# Patient Record
Sex: Male | Born: 1985 | Race: Black or African American | Hispanic: No | Marital: Single | Smoking: Never smoker
Health system: Southern US, Community
[De-identification: ages and names within clinical notes are randomized; demographics above are authoritative.]

## PROBLEM LIST (undated history)

## (undated) DIAGNOSIS — K5289 Other specified noninfective gastroenteritis and colitis: Secondary | ICD-10-CM

## (undated) DIAGNOSIS — N200 Calculus of kidney: Secondary | ICD-10-CM

## (undated) DIAGNOSIS — I10 Essential (primary) hypertension: Secondary | ICD-10-CM

## (undated) HISTORY — DX: Calculus of kidney: N20.0

## (undated) HISTORY — DX: Other specified noninfective gastroenteritis and colitis: K52.89

---
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
## 2004-05-03 ENCOUNTER — Encounter: Admission: RE | Admit: 2004-05-03 | Discharge: 2004-06-17 | Payer: Self-pay | Admitting: Specialist

## 2004-09-20 ENCOUNTER — Encounter: Admission: RE | Admit: 2004-09-20 | Discharge: 2004-12-19 | Payer: Self-pay | Admitting: *Deleted

## 2004-10-14 ENCOUNTER — Emergency Department (HOSPITAL_COMMUNITY): Admission: EM | Admit: 2004-10-14 | Discharge: 2004-10-14 | Payer: Self-pay | Admitting: Emergency Medicine

## 2005-03-04 ENCOUNTER — Emergency Department (HOSPITAL_COMMUNITY): Admission: EM | Admit: 2005-03-04 | Discharge: 2005-03-04 | Payer: Self-pay | Admitting: Emergency Medicine

## 2019-03-28 ENCOUNTER — Ambulatory Visit (INDEPENDENT_AMBULATORY_CARE_PROVIDER_SITE_OTHER): Payer: No Typology Code available for payment source | Admitting: Cardiology

## 2019-03-28 ENCOUNTER — Other Ambulatory Visit: Payer: Self-pay

## 2019-03-28 ENCOUNTER — Encounter: Payer: Self-pay | Admitting: Cardiology

## 2019-03-28 VITALS — BP 146/100 | HR 109 | Ht 75.0 in | Wt >= 6400 oz

## 2019-03-28 DIAGNOSIS — I10 Essential (primary) hypertension: Secondary | ICD-10-CM

## 2019-03-28 DIAGNOSIS — K5289 Other specified noninfective gastroenteritis and colitis: Secondary | ICD-10-CM | POA: Diagnosis not present

## 2019-03-28 DIAGNOSIS — R0609 Other forms of dyspnea: Secondary | ICD-10-CM | POA: Diagnosis not present

## 2019-03-28 HISTORY — DX: Essential (primary) hypertension: I10

## 2019-03-28 MED ORDER — AMLODIPINE BESYLATE 5 MG PO TABS: 5.0000 mg | ORAL_TABLET | Freq: Every day | ORAL | 3 refills | Status: DC

## 2019-03-28 NOTE — Progress Notes (Signed)
Patient referred by [name], MND for hypertension, shortness of breath  Subjective:   Henry Thomas, male    DOB: [birth year], 33 y.o.   MRN: [id number]   Chief Complaint  Patient presents with  . Hypertension  . Shortness of Breath    HPI  33 y.o. African American male with hypertension, morbid obesity, h/o mastocytic enterocolitis, strong family history of CAD and hypertension, now referred for management of hypertension and hypoxia/shortness of breath.   Patient is a massage therapist, unfortunately on unemployment currently, due to the pandemic. He is originally from [place]. He had been living in [place] for a few years, and moved back to [place] in 2018, but has not established care with a primary care physician yet. He is going to soon establish care with Dr. Willey Blade.   He has been seeing [name], NMD at [facility], who recently had a telephone visit with him, and were concerned about his hypertension and "low oxygen" noted on pulse oximeter-around 92%. They thus referred the patient to [place], at his request.   Patient has had progressively worsening shortness of breath on exertion with minimal activity. While he has always been obese, his shortness of breath only started about a year or two ago. He lives in a third floor apartment and has to stop several times while climbing upstairs, especially with grocery in his hands. He gets short of breath while bending down to tie his shoes. He also has chest pain with radiation to left arm, although this is not as reproducible with exertion as is the shortness of breath. His blood pressure is elevated. He is currently not on any antihypertensive medications.   Patient was diagnosed with mastocytic enterocolitis many years ago. He has not seen a GI physician in a long time, and was followed by [facility] for management of diarrhea, which has improved over the years.   Past  Medical History:  Diagnosis Date  . HTN (hypertension) 03/28/2019  . Kidney stones   . Mastocytic enterocolitis      History reviewed. No pertinent surgical history.   Social History   Socioeconomic History  . Marital status: Single    Spouse name: Not on file  . Number of children: Not on file  . Years of education: Not on file  . Highest education level: Not on file  Occupational History  . Not on file  Social Needs  . Financial resource strain: Not on file  . Food insecurity    Worry: Not on file    Inability: Not on file  . Transportation needs    Medical: Not on file    Non-medical: Not on file  Tobacco Use  . Smoking status: Not on file  Substance and Sexual Activity  . Alcohol use: Not on file  . Drug use: Not on file  . Sexual activity: Not on file  Lifestyle  . Physical activity    Days per week: Not on file    Minutes per session: Not on file  . Stress: Not on file  Relationships  . Social Herbalist on phone: Not on file    Gets together: Not on file    Attends religious service: Not on file    Active member of club or organization: Not on file    Attends meetings of clubs or organizations: Not on file    Relationship status: Not on file  . Intimate partner  violence    Fear of current or ex partner: Not on file    Emotionally abused: Not on file    Physically abused: Not on file    Forced sexual activity: Not on file  Other Topics Concern  . Not on file  Social History Narrative  . Not on file     Family History  Problem Relation Age of Onset  . Hypertension Mother   . Hypertension Father   . Hypertension Sister   . Heart attack Maternal Uncle        MI and CABG in 11s  . Heart attack Maternal Grandfather   . Stroke Maternal Grandfather     Current Outpatient Medications on File Prior to Visit  Medication Sig Dispense Refill  . Multiple Vitamin (MULTIVITAMIN) capsule Take 1 capsule by mouth daily.    . Probiotic Product  (PROBIOTIC DAILY PO) Take by mouth daily.    . TURMERIC PO Take by mouth daily.     No current facility-administered medications on file prior to visit.     Cardiovascular studies:  EKG 03/28/2019: Sinus rhythm 94 bpm. 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  Poor R-wave progression -nondiagnostic for this age.    Recent labs: 02/06/2019: Glucose 100. BUN/Cr 9/1.21. eGFR 78/90. Na/K 142/4.7. HbA1C 5.8%.  H/H 17/52. MCV 81. Platelets 278.  Review of Systems  Constitution: Positive for malaise/fatigue. Negative for decreased appetite, weight gain and weight loss.       Snoring +  HENT: Negative for congestion.   Eyes: Negative for visual disturbance.  Cardiovascular: Positive for chest pain and dyspnea on exertion. Negative for leg swelling, palpitations and syncope.  Respiratory: Positive for shortness of breath. Negative for cough.   Endocrine: Negative for cold intolerance.  Hematologic/Lymphatic: Does not bruise/bleed easily.  Skin: Negative for itching and rash.  Musculoskeletal: Negative for myalgias.  Gastrointestinal: Negative for abdominal pain, nausea and vomiting.  Genitourinary: Negative for dysuria.  Neurological: Negative for dizziness and weakness.  Psychiatric/Behavioral: The patient is not nervous/anxious.   All other systems reviewed and are negative.        Vitals:   03/28/19 1437  BP: (!) 146/100  Pulse: (!) 109  SpO2: 95%     Body mass index is 51.87 kg/m. Filed Weights   03/28/19 1437  Weight: (!) 415 lb (188.2 kg)     Objective:   Physical Exam  Constitutional: He is oriented to person, place, and time. He appears well-developed and well-nourished. No distress.  Morbidly obese  HENT:  Head: Normocephalic and atraumatic.  Eyes: Pupils are equal, round, and reactive to light. Conjunctivae are normal.  Neck: No JVD present.  Cardiovascular: Normal rate, regular rhythm and intact distal pulses.  Pulmonary/Chest: Effort normal  and breath sounds normal. He has no wheezes. He has no rales.  Abdominal: Soft. Bowel sounds are normal. There is no rebound.  Musculoskeletal:        General: No edema (Trace b/l).  Lymphadenopathy:    He has no cervical adenopathy.  Neurological: He is alert and oriented to person, place, and time. No cranial nerve deficit.  Skin: Skin is warm and dry.  Trophic skin changes on neck   Psychiatric: He has a normal mood and affect.  Nursing note and vitals reviewed.         Assessment & Recommendations:   33 y.o. African American male with hypertension, morbid obesity, h/o mastocytic enterocolitis, strong family history of CAD and hypertension, now referred for management  of hypertension and hypoxia/shortness of breath.   Exertional dyspnea: Likely multifactorial, including hypertension, morbid obesity. I will obtain echocardiogram to evaluate for cardiomyopathy. After improving blood pressure and stabilizing his dyspnea, I will also perform a stress test-although this is likely to be limited due to his body habitus. I am also concerned that he likely has OSA/OHS. If cardiac workup is unremarkable, and dyspnea persists in spite of controlled blood pressure, he will likely need referral to pulmonology.   Hypertension: Start amlodipine 5 mg daily. Due to his history of kidney stones, I will try to avoid HCTZ/chlorthalidone.   In addition, I have encouraged him to establish care with PCP.  Thank you for referring the patient to [place]. Please feel free to contact with any questions.  Nigel Mormon, MD [facility] Pager: [phone] Office: [phone] If no answer Cell [phone]

## 2019-03-29 ENCOUNTER — Encounter: Payer: Self-pay | Admitting: Cardiology

## 2019-03-29 DIAGNOSIS — R0609 Other forms of dyspnea: Secondary | ICD-10-CM | POA: Insufficient documentation

## 2019-03-29 DIAGNOSIS — K5289 Other specified noninfective gastroenteritis and colitis: Secondary | ICD-10-CM | POA: Insufficient documentation

## 2019-04-01 ENCOUNTER — Telehealth: Payer: Self-pay

## 2019-04-01 NOTE — Telephone Encounter (Signed)
Telephone encounter:  Reason for call: Pt states the amlodipine that you put him on has not lowered his BP and he is concerned   Usual provider: MP  Last office visit: 03/28/2019  Next office visit: 04/19/2019   Last hospitalization:  Current Outpatient Medications on File Prior to Visit  Medication Sig Dispense Refill  . amLODipine (NORVASC) 5 MG tablet Take 1 tablet (5 mg total) by mouth daily. 30 tablet 3  . Multiple Vitamin (MULTIVITAMIN) capsule Take 1 capsule by mouth daily.    . Probiotic Product (PROBIOTIC DAILY PO) Take by mouth daily.    . TURMERIC PO Take by mouth daily.     No current facility-administered medications on file prior to visit.

## 2019-04-01 NOTE — Telephone Encounter (Signed)
Increase to 10 mg daily. Please keep [place] posted.  Thanks MJP

## 2019-04-03 NOTE — Telephone Encounter (Signed)
Left detailed vm °

## 2019-04-19 ENCOUNTER — Ambulatory Visit (INDEPENDENT_AMBULATORY_CARE_PROVIDER_SITE_OTHER): Payer: No Typology Code available for payment source

## 2019-04-19 ENCOUNTER — Encounter: Payer: Self-pay | Admitting: Cardiology

## 2019-04-19 ENCOUNTER — Other Ambulatory Visit: Payer: Self-pay

## 2019-04-19 ENCOUNTER — Ambulatory Visit (INDEPENDENT_AMBULATORY_CARE_PROVIDER_SITE_OTHER): Payer: No Typology Code available for payment source | Admitting: Cardiology

## 2019-04-19 DIAGNOSIS — I10 Essential (primary) hypertension: Secondary | ICD-10-CM

## 2019-04-19 DIAGNOSIS — R0609 Other forms of dyspnea: Secondary | ICD-10-CM

## 2019-04-19 MED ORDER — AMLODIPINE BESYLATE 5 MG PO TABS: 10.0000 mg | ORAL_TABLET | Freq: Every day | ORAL | 3 refills | Status: DC

## 2019-04-19 MED ORDER — HYDROCHLOROTHIAZIDE 25 MG PO TABS: 25.0000 mg | ORAL_TABLET | Freq: Every day | ORAL | 3 refills | Status: DC

## 2019-04-19 NOTE — Progress Notes (Signed)
Patient referred by [name], MND for hypertension, shortness of breath  Subjective:   Henry Thomas, male    DOB: [birth year], 33 y.o.   MRN: [id number]  Chief Complaint  Patient presents with  . Shortness of Breath  . Follow-up    HPI  33 y.o. African American male with hypertension, morbid obesity, h/o mastocytic enterocolitis, strong family history of CAD and hypertension, with exertional dyspnea  Echocardiogram was unremarkable, other than mo LVH and grade 1 DD. Blood pressure remains elevated.   Past Medical History:  Diagnosis Date  . HTN (hypertension) 03/28/2019  . Kidney stones   . Mastocytic enterocolitis      No past surgical history on file.   Social History   Socioeconomic History  . Marital status: Single    Spouse name: Not on file  . Number of children: 0  . Years of education: Not on file  . Highest education level: Not on file  Occupational History  . Not on file  Social Needs  . Financial resource strain: Not on file  . Food insecurity    Worry: Not on file    Inability: Not on file  . Transportation needs    Medical: Not on file    Non-medical: Not on file  Tobacco Use  . Smoking status: Never Smoker  . Smokeless tobacco: Never Used  Substance and Sexual Activity  . Alcohol use: Not Currently  . Drug use: Not on file  . Sexual activity: Not on file  Lifestyle  . Physical activity    Days per week: Not on file    Minutes per session: Not on file  . Stress: Not on file  Relationships  . Social Herbalist on phone: Not on file    Gets together: Not on file    Attends religious service: Not on file    Active member of club or organization: Not on file    Attends meetings of clubs or organizations: Not on file    Relationship status: Not on file  . Intimate partner violence    Fear of current or ex partner: Not on file    Emotionally abused: Not on file    Physically abused: Not on file    Forced sexual activity:  Not on file  Other Topics Concern  . Not on file  Social History Narrative  . Not on file     Family History  Problem Relation Age of Onset  . Hypertension Mother   . Hypertension Father   . Hypertension Sister   . Heart attack Maternal Uncle        MI and CABG in 16s  . Heart attack Maternal Grandfather   . Stroke Maternal Grandfather     Current Outpatient Medications on File Prior to Visit  Medication Sig Dispense Refill  . amLODipine (NORVASC) 5 MG tablet Take 1 tablet (5 mg total) by mouth daily. 30 tablet 3  . Multiple Vitamin (MULTIVITAMIN) capsule Take 1 capsule by mouth daily.    . Probiotic Product (PROBIOTIC DAILY PO) Take by mouth daily.    . TURMERIC PO Take by mouth daily.     No current facility-administered medications on file prior to visit.     Cardiovascular studies:  Echocardiogram 04/19/2019: Normal LV systolic function with EF 55%. Left ventricle cavity is normal in size. Moderate concentric hypertrophy of the left ventricle. Normal global wall motion. Doppler evidence of grade I (impaired) diastolic dysfunction, normal  LAP. Calculated EF 55%. Left atrial cavity is mildly dilated. Inadequate TR jet to estimate pulmonary artery systolic pressure. Estimated RA pressure 8 mmHg.  EKG 03/28/2019: Sinus rhythm 94 bpm. 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  Poor R-wave progression -nondiagnostic for this age.    Recent labs: 02/06/2019: Glucose 100. BUN/Cr 9/1.21. eGFR 78/90. Na/K 142/4.7. HbA1C 5.8%.  H/H 17/52. MCV 81. Platelets 278.  Review of Systems  Constitution: Positive for malaise/fatigue. Negative for decreased appetite, weight gain and weight loss.       Snoring +  HENT: Negative for congestion.   Eyes: Negative for visual disturbance.  Cardiovascular: Positive for chest pain and dyspnea on exertion. Negative for leg swelling, palpitations and syncope.  Respiratory: Positive for shortness of breath. Negative for cough.    Endocrine: Negative for cold intolerance.  Hematologic/Lymphatic: Does not bruise/bleed easily.  Skin: Negative for itching and rash.  Musculoskeletal: Negative for myalgias.  Gastrointestinal: Negative for abdominal pain, nausea and vomiting.  Genitourinary: Negative for dysuria.  Neurological: Negative for dizziness and weakness.  Psychiatric/Behavioral: The patient is not nervous/anxious.   All other systems reviewed and are negative.        Vitals:   04/19/19 1344  BP: (!) 153/83  Pulse: 98  Temp: 97.8 F (36.6 C)  SpO2: 97%     Body mass index is 53 kg/m. Filed Weights   04/19/19 1344  Weight: (!) 424 lb (192.3 kg)     Objective:   Physical Exam  Constitutional: He is oriented to person, place, and time. He appears well-developed and well-nourished. No distress.  Morbidly obese  HENT:  Head: Normocephalic and atraumatic.  Eyes: Pupils are equal, round, and reactive to light. Conjunctivae are normal.  Neck: No JVD present.  Cardiovascular: Normal rate, regular rhythm and intact distal pulses.  Pulmonary/Chest: Effort normal and breath sounds normal. He has no wheezes. He has no rales.  Abdominal: Soft. Bowel sounds are normal. There is no rebound.  Musculoskeletal:        General: No edema (Trace b/l).  Lymphadenopathy:    He has no cervical adenopathy.  Neurological: He is alert and oriented to person, place, and time. No cranial nerve deficit.  Skin: Skin is warm and dry.  Trophic skin changes on neck   Psychiatric: He has a normal mood and affect.  Nursing note and vitals reviewed.         Assessment & Recommendations:   33 y.o. African American male with hypertension, morbid obesity, h/o mastocytic enterocolitis, strong family history of CAD and hypertension, with exertional dyspnea  Exertional dyspnea: Likely multifactorial, including hypertension, morbid obesity. Echocardiogram shows normal systolic function, grade 1 DD. Blood pressure  remains elevated. I added HCTZ 25 mg.. Refilled amlodipine 10 mg. I have also referred him to pulmonology given concern for OSA/OHA.  Hypertension: As above.  F/u in 3 months.  Nigel Mormon, MD [facility] Pager: [phone] Office: [phone] If no answer Cell [phone]

## 2019-04-20 ENCOUNTER — Emergency Department (HOSPITAL_COMMUNITY): Payer: PRIVATE HEALTH INSURANCE

## 2019-04-20 ENCOUNTER — Emergency Department (HOSPITAL_COMMUNITY)
Admission: EM | Admit: 2019-04-20 | Discharge: 2019-04-20 | Disposition: A | Discharge status: Home / Self Care | Payer: PRIVATE HEALTH INSURANCE | Attending: Emergency Medicine | Admitting: Emergency Medicine

## 2019-04-20 ENCOUNTER — Other Ambulatory Visit: Payer: Self-pay

## 2019-04-20 ENCOUNTER — Encounter (HOSPITAL_COMMUNITY): Payer: Self-pay | Admitting: *Deleted

## 2019-04-20 DIAGNOSIS — Z79899 Other long term (current) drug therapy: Secondary | ICD-10-CM | POA: Insufficient documentation

## 2019-04-20 DIAGNOSIS — T7840XA Allergy, unspecified, initial encounter: Secondary | ICD-10-CM | POA: Diagnosis not present

## 2019-04-20 DIAGNOSIS — R0989 Other specified symptoms and signs involving the circulatory and respiratory systems: Secondary | ICD-10-CM | POA: Diagnosis present

## 2019-04-20 DIAGNOSIS — I10 Essential (primary) hypertension: Secondary | ICD-10-CM | POA: Insufficient documentation

## 2019-04-20 LAB — BASIC METABOLIC PANEL
Anion gap: 10 (ref 5–15)
BUN: 9 mg/dL (ref 6–20)
CO2: 20 mmol/L — ABNORMAL LOW (ref 22–32)
Calcium: 8.8 mg/dL — ABNORMAL LOW (ref 8.9–10.3)
Chloride: 108 mmol/L (ref 98–111)
Creatinine, Ser: 1.09 mg/dL (ref 0.61–1.24)
GFR calc Af Amer: >60 mL/min (ref >60)
GFR calc non Af Amer: >60 mL/min (ref >60)
Glucose, Bld: 116 mg/dL — ABNORMAL HIGH (ref 70–99)
Potassium: 3.9 mmol/L (ref 3.5–5.1)
Sodium: 138 mmol/L (ref 135–145)

## 2019-04-20 LAB — CBC
HCT: 51.7 % (ref 39.0–52.0)
Hemoglobin: 16.1 g/dL (ref 13.0–17.0)
MCH: 26.3 pg (ref 26.0–34.0)
MCHC: 31.1 g/dL (ref 30.0–36.0)
MCV: 84.5 fL (ref 80.0–100.0)
Platelets: 279 10*3/uL (ref 150–400)
RBC: 6.12 MIL/uL — ABNORMAL HIGH (ref 4.22–5.81)
RDW: 14.8 % (ref 11.5–15.5)
WBC: 8.1 10*3/uL (ref 4.0–10.5)
nRBC: 0 % (ref 0.0–0.2)

## 2019-04-20 MED ORDER — DIPHENHYDRAMINE HCL 25 MG PO TABS: 25.0000 mg | ORAL_TABLET | Freq: Four times a day (QID) | ORAL | 0 refills | Status: AC | PRN

## 2019-04-20 MED ORDER — DEXAMETHASONE SODIUM PHOSPHATE 10 MG/ML IJ SOLN
10.0000 mg | Freq: Once | INTRAMUSCULAR | Status: AC
  Administered 2019-04-20: 05:00:00 10 mg via INTRAVENOUS
  Filled 2019-04-20: qty 1

## 2019-04-20 MED ORDER — DIPHENHYDRAMINE HCL 50 MG/ML IJ SOLN
25.0000 mg | Freq: Once | INTRAMUSCULAR | Status: AC
Start: 2019-04-20 — End: 2019-04-20
  Administered 2019-04-20: 05:00:00 25 mg via INTRAVENOUS
  Filled 2019-04-20: qty 1

## 2019-04-20 MED ORDER — PREDNISONE 10 MG PO TABS: 20.0000 mg | ORAL_TABLET | Freq: Two times a day (BID) | ORAL | 0 refills | Status: AC

## 2019-04-20 MED ORDER — SODIUM CHLORIDE 0.9% FLUSH
3.0000 mL | Freq: Once | INTRAVENOUS | Status: AC
  Administered 2019-04-20: 04:00:00 3 mL via INTRAVENOUS

## 2019-04-20 MED ORDER — FAMOTIDINE IN NACL 20-0.9 MG/50ML-% IV SOLN
20.0000 mg | Freq: Once | INTRAVENOUS | Status: AC
  Administered 2019-04-20: 20 mg via INTRAVENOUS
  Filled 2019-04-20: qty 50

## 2019-04-20 NOTE — ED Triage Notes (Signed)
The pt woke up approx 0230 from sleep feeling like he could not breathe  He feels like something is blocking his airway.  Both his hands are sl swelling and redness   He has not taken anything

## 2019-04-20 NOTE — Discharge Instructions (Addendum)
You were seen in the emergency department today for throat swelling.  This may have been related to an allergic reaction.  You were given steroids, Benadryl, and Pepcid in the emergency department with some improvement.  We are sending you home with a short course of prednisone as well as as needed Benadryl, please take these as prescribed.  We have prescribed you new medication(s) today. Discuss the medications prescribed today with your pharmacist as they can have adverse effects and interactions with your other medicines including over the counter and prescribed medications. Seek medical evaluation if you start to experience new or abnormal symptoms after taking one of these medicines, seek care immediately if you start to experience difficulty breathing, feeling of your throat closing, facial swelling, or rash as these could be indications of a more serious allergic reaction  Please follow-up with your primary care provider within 3 days.  Return to the emergency department immediately for throat closing sensation again, trouble breathing, vomiting, passing out, noisy breathing, or any other concerns.

## 2019-04-20 NOTE — ED Provider Notes (Addendum)
[facility] EMERGENCY DEPARTMENT Provider Note   CSN: [id number] Arrival date & time: 04/20/19  2423     History   Chief Complaint Chief Complaint  Patient presents with  . Allergic Reaction    HPI Henry Thomas is a 33 y.o. male with a history of hypertension and obesity who presents to the emergency department with complaints of abnormal sensation to the throat that began at 0230 this a.m. when he awoke from sleep.  Patient states that he ate a fairly typical dinner for him last night prior to going to bed, did not have any difficulty swallowing any part of his meal, went to sleep feeling at baseline.  States that he woke from sleep at 230 feeling like something was stuck in his throat, he feels that his throat is swollen/closing somewhat and did feel short of breath at onset, however this seems to be somewhat improved upon arrival to the ER.  No alleviating or aggravating factors.  He states that he denies any associated pain with this.    He denies facial swelling, chest pain, dyspnea, nausea, vomiting, or rash.  He states he tried drinking fluids prior to arrival which he was able to keep down but it did not seem to change his symptoms much.  He states he has a history of somewhat similar a few years ago, but at that time had a fever and had an infection, has not had fever or infectious symptoms recently.  He has not had any new exposures, medicines, or foods recently.  He was recently given prescriptions for hydrochlorothiazide and amlodipine for blood pressure management but has not started taking these yet.     HPI  Past Medical History:  Diagnosis Date  . HTN (hypertension) 03/28/2019  . Kidney stones   . Mastocytic enterocolitis     Patient Active Problem List   Diagnosis Date Noted  . Exertional dyspnea 03/29/2019  . Morbid obesity ([place]) 03/29/2019  . 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 HTN (hypertension) 03/28/2019    History reviewed. No  pertinent surgical history.      Home Medications    Prior to Admission medications   Medication Sig Start Date End Date Taking? Authorizing Provider  amLODipine (NORVASC) 5 MG tablet Take 2 tablets (10 mg total) by mouth daily. 04/19/19 07/18/19  Patwardhan, Reynold Bowen, MD  hydrochlorothiazide (HYDRODIURIL) 25 MG tablet Take 1 tablet (25 mg total) by mouth daily. 04/19/19 07/18/19  Patwardhan, Reynold Bowen, MD  Multiple Vitamin (MULTIVITAMIN) capsule Take 1 capsule by mouth daily.    [provider]  Probiotic Product (PROBIOTIC DAILY PO) Take by mouth daily.    [provider]  TURMERIC PO Take by mouth daily.    [provider]    Family History Family History  Problem Relation Age of Onset  . Hypertension Mother   . Hypertension Father   . Hypertension Sister   . Heart attack Maternal Uncle        MI and CABG in 9s  . Heart attack Maternal Grandfather   . Stroke Maternal Grandfather     Social History Social History   Tobacco Use  . Smoking status: Never Smoker  . Smokeless tobacco: Never Used  Substance Use Topics  . Alcohol use: Not Currently  . Drug use: Not on file     Allergies   Patient has no known allergies.   Review of Systems Review of Systems  Constitutional: Negative for chills and fever.  HENT:  Positive for trouble swallowing. Negative for congestion, ear pain and sore throat.   Respiratory: Negative for shortness of breath and wheezing.   Cardiovascular: Negative for chest pain.  Gastrointestinal: Negative for nausea and vomiting.  Skin: Negative for rash.  Neurological: Negative for syncope, weakness and numbness.  All other systems reviewed and are negative.    Physical Exam Updated Vital Signs BP (!) 160/90   Pulse 94   Temp 98.6 F (37 C) (Oral)   Resp 20   Ht 6\' 3"  (1.905 m)   Wt (!) 192.3 kg   SpO2 95%   BMI 52.99 kg/m   Physical Exam Vitals signs and nursing note reviewed.  Constitutional:       General: He is not in acute distress.    Appearance: He is well-developed. He is not toxic-appearing.  HENT:     Head: Normocephalic and atraumatic.     Right Ear: Tympanic membrane, ear canal and external ear normal.     Left Ear: Tympanic membrane, ear canal and external ear normal.     Nose: Nose normal.     Mouth/Throat:     Mouth: Mucous membranes are moist.     Comments: No facial/lip swelling. Uvula is midline and appears slightly swollen, no other swelling noted in the oral/posterior oropharynx..  No tonsillar edema/exudate/erythema. Posterior oropharynx is symmetric appearing. Patient tolerating own secretions without difficulty. No trismus. No drooling. No hot potato voice. No swelling beneath the tongue, submandibular compartment is soft.  Eyes:     General:        Right eye: No discharge.        Left eye: No discharge.     Conjunctiva/sclera: Conjunctivae normal.  Neck:     Musculoskeletal: Neck supple. No edema, neck rigidity, crepitus or pain with movement.     Thyroid: No thyroid mass.  Cardiovascular:     Rate and Rhythm: Normal rate and regular rhythm.  Pulmonary:     Effort: Pulmonary effort is normal. No respiratory distress.     Breath sounds: Normal breath sounds. No stridor. No wheezing, rhonchi or rales.  Abdominal:     General: There is no distension.     Palpations: Abdomen is soft.     Tenderness: There is no abdominal tenderness.  Skin:    General: Skin is warm and dry.     Findings: Rash is not urticarial.     Comments: Mild erythema noted to the hands, not urticarial in nature.  Neurological:     Mental Status: He is alert.     Comments: Clear speech.   Psychiatric:        Behavior: Behavior normal.     ED Treatments / Results  Labs (all labs ordered are listed, but only abnormal results are displayed) Labs Reviewed  BASIC METABOLIC PANEL - Abnormal; Notable for the following components:      Result Value   CO2 20 (*)    Glucose, Bld 116  (*)    Calcium 8.8 (*)    All other components within normal limits  CBC - Abnormal; Notable for the following components:   RBC 6.12 (*)    All other components within normal limits    EKG None  Radiology Dg Chest 2 View  Result Date: 04/20/2019 CLINICAL DATA:  Shortness of breath EXAM: CHEST - 2 VIEW COMPARISON:  None. FINDINGS: The heart size and mediastinal contours are within normal limits. Both lungs are clear. The visualized skeletal structures are unremarkable. IMPRESSION:  No active cardiopulmonary disease. Electronically Signed   By: Deatra Robinson M.D.   On: 04/20/2019 05:17    Procedures Procedures (including critical care time)  Medications Ordered in ED Medications  sodium chloride flush (NS) 0.9 % injection 3 mL (3 mLs Intravenous Given 04/20/19 0418)  diphenhydrAMINE (BENADRYL) injection 25 mg (25 mg Intravenous Given 04/20/19 0452)  famotidine (PEPCID) IVPB 20 mg premix (0 mg Intravenous Stopped 04/20/19 0609)  dexamethasone (DECADRON) injection 10 mg (10 mg Intravenous Given 04/20/19 0453)     Initial Impression / Assessment and Plan / ED Course  I have reviewed the triage vital signs and the nursing notes.  Pertinent labs & imaging results that were available during my care of the patient were reviewed by me and considered in my medical decision making (see chart for details).   Patient presents to the emergency department with abnormal sensation to his throat.  Nontoxic-appearing, no apparent distress, vitals with elevated BP, otherwise within normal limits.  Doubt HTN emergency.  On exam he has minimal swelling noted to the uvula, no other swelling to the face/oropharynx.  He is tolerating his own secretions without difficulty, no trismus, no drooling, no wheezing, no stridor. Possible degree of allergic reaction of unclear source, does not appear to be in anaphylactic shock, discussed w/ Dr. Judd Lien who has evaluated patient @ bedside, recommends  decadron/benadryl/pepcid & monitoring.   Work-up per triage: CBC: No anemia or leukocytosis.  BMP: No significant electrolyte derangement CXR: No acute abnormalities.  07:10: RE-EVAL: Patient feeling somewhat improved.  Tolerating own secretions w/o difficulty. No signs of respiratory distress. Has been ambulatory throughout the ED. Re-discussed w/ Dr. Judd Lien- recommends discharge home with 20 mg of prednisone BID for a few days and benadryl PRN which I am in agreement with.   I discussed results, treatment plan, need for follow-up, and return precautions with the patient. Provided opportunity for questions, patient confirmed understanding and is in agreement with plan.      This is a shared visit with supervising physician Dr. Judd Lien who has independently evaluated patient & provided guidance in evaluation/management/disposition, in agreement with care    Final Clinical Impressions(s) / ED Diagnoses   Final diagnoses:  Allergic reaction, initial encounter    ED Discharge Orders         Ordered    predniSONE (DELTASONE) 10 MG tablet  2 times daily     04/20/19 0713    diphenhydrAMINE (BENADRYL) 25 MG tablet  Every 6 hours PRN     04/20/19 0713           Cherly Anderson, PA-C 04/20/19 0716    Jaevion Goto, Pleas Koch, PA-C 04/20/19 4076    Geoffery Lyons, MD 04/20/19 2312

## 2019-04-20 NOTE — ED Notes (Signed)
Pt states swelling and redness to bilateral hands is normal. Also states that "bumps" on his head are normal.

## 2019-04-20 NOTE — ED Notes (Signed)
Patient verbalizes understanding of discharge instructions. Opportunity for questioning and answers were provided. Armband removed by staff, pt discharged from ED.  

## 2019-05-14 ENCOUNTER — Other Ambulatory Visit: Payer: Self-pay

## 2019-05-14 ENCOUNTER — Encounter: Payer: Self-pay | Admitting: Pulmonary Disease

## 2019-05-14 ENCOUNTER — Ambulatory Visit (INDEPENDENT_AMBULATORY_CARE_PROVIDER_SITE_OTHER): Payer: No Typology Code available for payment source | Admitting: Pulmonary Disease

## 2019-05-14 DIAGNOSIS — R0600 Dyspnea, unspecified: Secondary | ICD-10-CM | POA: Diagnosis not present

## 2019-05-14 DIAGNOSIS — R0602 Shortness of breath: Secondary | ICD-10-CM | POA: Diagnosis not present

## 2019-05-14 DIAGNOSIS — G4733 Obstructive sleep apnea (adult) (pediatric): Secondary | ICD-10-CM | POA: Diagnosis not present

## 2019-05-14 DIAGNOSIS — R0609 Other forms of dyspnea: Secondary | ICD-10-CM

## 2019-05-14 NOTE — Patient Instructions (Signed)
Proceed with CT angiogram chest to rule out blood clots  Schedule home sleep study. Based on this we will set you up with a CPAP machine

## 2019-05-14 NOTE — Progress Notes (Signed)
Subjective:     Patient ID: Henry Thomas, male   DOB: August 05, 1986, 33 y.o.   MRN: [id number]  HPI   Chief Complaint  Patient presents with  . Consult    Shortness of breath when he exerts himself. Stated there are times he feels ok and other times he feels like he hsa been in a marathon.    33 year old morbidly obese man presents for evaluation of dyspnea on exertion.  He has been at his current weight for a few years and in fact states that he has been more active during the pandemic. Dyspnea on exertion has been present for at least 9 months and has progressed to class III-IV.  He feels very dyspneic on climbing up to his third floor apartment.  At times even simple tasks such as doing his dishes causes him to be dyspneic.  He does have an oximeter and occasionally has found that when he lies down saturations fall to 88% but this has not occurred in the past few months.  Sitting up and resting makes his dyspnea , occasionally he will turn the fan on to get more air.  His PCP gave him Symbicort and this helps sometimes.  On 04/20/2019 had an ED visit for throat swelling and he woke up and felt like his uvula was stuck to his tongue, he was given prednisone for a few days with improvement.  Chest x-ray which I reviewed does not show any infiltrates or effusions.  Basic labs appear normal except for creatinine of 1.1  He underwent cardiology evaluation including an echo which showed LVH and grade 2 diastolic dysfunction, has been placed on thiazide diuretic.  He denies paroxysmal nocturnal dyspnea, orthopnea or significant pedal edema  He was diagnosed with mastocytic enterocolitis in 2010 when he used to live in [place], moved to [place] 2 years ago.  He has chronic diarrhea and bloating symptoms since then.   Epworth sleepiness score is 14 and he reports excessive daytime fatigue and sleepiness. He is tired all day.  Bedtime is anywhere between 10 and 11 PM but he is not consistent  with this, he wakes up every hour.  He sleeps on his side with one pillow, reports 3-4 episodes of nocturia and is out of bed at 7 AM feeling tired with dryness of his mouth and occasional headaches. There is no history suggestive of cataplexy, sleep paralysis or parasomnias     Past Medical History:  Diagnosis Date  . HTN (hypertension) 03/28/2019  . Kidney stones   . Mastocytic enterocolitis     No past surgical history on file.  No Known Allergies  Social History   Socioeconomic History  . Marital status: Single    Spouse name: Not on file  . Number of children: 0  . Years of education: Not on file  . Highest education level: Not on file  Occupational History  . Not on file  Social Needs  . Financial resource strain: Not on file  . Food insecurity    Worry: Not on file    Inability: Not on file  . Transportation needs    Medical: Not on file    Non-medical: Not on file  Tobacco Use  . Smoking status: Never Smoker  . Smokeless tobacco: Never Used  Substance and Sexual Activity  . Alcohol use: Not Currently  . Drug use: Not on file  . Sexual activity: Not on file  Lifestyle  . Physical activity    Days  per week: Not on file    Minutes per session: Not on file  . Stress: Not on file  Relationships  . Social Herbalist on phone: Not on file    Gets together: Not on file    Attends religious service: Not on file    Active member of club or organization: Not on file    Attends meetings of clubs or organizations: Not on file    Relationship status: Not on file  . Intimate partner violence    Fear of current or ex partner: Not on file    Emotionally abused: Not on file    Physically abused: Not on file    Forced sexual activity: Not on file  Other Topics Concern  . Not on file  Social History Narrative  . Not on file     Family History  Problem Relation Age of Onset  . Hypertension Mother   . Hypertension Father   . Hypertension Sister   .  Heart attack Maternal Uncle        MI and CABG in 69s  . Heart attack Maternal Grandfather   . Stroke Maternal Grandfather      Review of Systems  Reason unable to perform ROS: Tree Pollen.  Constitutional: Positive for activity change and fatigue.  HENT: Negative.   Eyes: Negative.   Respiratory: Positive for apnea, choking, shortness of breath and wheezing.   Cardiovascular: Positive for palpitations.  Gastrointestinal: Positive for abdominal distention, abdominal pain, blood in stool and diarrhea.  Endocrine: Negative.   Genitourinary: Positive for frequency and hematuria.  Musculoskeletal: Positive for back pain and neck pain.  Skin: Positive for rash.       Hives  Allergic/Immunologic: Positive for environmental allergies, food allergies and immunocompromised state.       Corn, Barley  Neurological: Positive for dizziness, speech difficulty, weakness and light-headedness.  Hematological: Negative.   Psychiatric/Behavioral: Positive for decreased concentration. The patient is nervous/anxious.        Objective:   Physical Exam    Gen. Pleasant, obese, in no distress, normal affect ENT - no pallor,icterus, no post nasal drip, class 2-3 airway Neck: No JVD, no thyromegaly, no carotid bruits Lungs: no use of accessory muscles, no dullness to percussion, decreased without rales or rhonchi  Cardiovascular: Rhythm regular, heart sounds  normal, no murmurs or gallops, no peripheral edema Abdomen: soft and non-tender, no hepatosplenomegaly, BS normal. Musculoskeletal: No deformities, no cyanosis or clubbing Neuro:  alert, non focal, no tremors

## 2019-05-14 NOTE — Addendum Note (Signed)
Addended by: Rigoberto Noel on: 05/14/2019 10:51 AM   Modules accepted: Orders

## 2019-05-14 NOTE — Assessment & Plan Note (Signed)
His dyspnea is likely related to morbid obesity but given subacute worsening over the past 6 months we have to investigate for the usual culprits. Can proceed with CT angiogram provided his weight will allow-this will enable [place] to rule out pulmonary emboli as a cause and also look at his parenchyma and rule out other issues such as sarcoidosis.  If his weight does not qualify, then can do VQ scan instead  We will continue on his Symbicort currently and if no other causes found then we can proceed with PFTs

## 2019-05-14 NOTE — Assessment & Plan Note (Signed)
Given excessive daytime somnolence, narrow pharyngeal exam, witnessed apneas & loud snoring, obstructive sleep apnea is very likely & an overnight polysomnogram will be scheduled as a  Home or split study. The pathophysiology of obstructive sleep apnea , it's cardiovascular consequences & modes of treatment including CPAP were discused with the patient in detail & they evidenced understanding.  It is very likely that his sleep issues are related to OSA.  He may not have obesity hypoventilation since his bicarbonate is 20.  We will obtain an ABG in the future if needed

## 2019-05-23 ENCOUNTER — Ambulatory Visit (HOSPITAL_BASED_OUTPATIENT_CLINIC_OR_DEPARTMENT_OTHER)
Admission: RE | Admit: 2019-05-23 | Discharge: 2019-05-23 | Disposition: A | Discharge status: Home / Self Care | Payer: PRIVATE HEALTH INSURANCE | Source: Ambulatory Visit | Attending: Pulmonary Disease | Admitting: Pulmonary Disease

## 2019-05-23 ENCOUNTER — Encounter (HOSPITAL_BASED_OUTPATIENT_CLINIC_OR_DEPARTMENT_OTHER): Payer: Self-pay

## 2019-05-23 ENCOUNTER — Other Ambulatory Visit: Payer: Self-pay

## 2019-05-23 DIAGNOSIS — R0602 Shortness of breath: Secondary | ICD-10-CM | POA: Diagnosis not present

## 2019-05-23 MED ORDER — IOHEXOL 350 MG/ML SOLN
100.0000 mL | Freq: Once | INTRAVENOUS | Status: AC | PRN
  Administered 2019-05-23: 11:00:00 100 mL via INTRAVENOUS

## 2019-05-27 ENCOUNTER — Other Ambulatory Visit: Payer: Self-pay

## 2019-05-27 ENCOUNTER — Ambulatory Visit: Payer: No Typology Code available for payment source

## 2019-05-27 DIAGNOSIS — G4733 Obstructive sleep apnea (adult) (pediatric): Secondary | ICD-10-CM

## 2019-05-28 ENCOUNTER — Other Ambulatory Visit: Payer: Self-pay | Admitting: Cardiology

## 2019-05-28 DIAGNOSIS — R0609 Other forms of dyspnea: Secondary | ICD-10-CM

## 2019-05-28 DIAGNOSIS — G4733 Obstructive sleep apnea (adult) (pediatric): Secondary | ICD-10-CM

## 2019-05-28 DIAGNOSIS — I10 Essential (primary) hypertension: Secondary | ICD-10-CM

## 2019-05-29 ENCOUNTER — Telehealth: Payer: Self-pay | Admitting: Pulmonary Disease

## 2019-05-29 DIAGNOSIS — G4733 Obstructive sleep apnea (adult) (pediatric): Secondary | ICD-10-CM

## 2019-05-29 NOTE — Telephone Encounter (Signed)
HST 05/28/19 showed moderate OSA AHI 19/h Suggest autoCPAP 5-20 cm , FF mask of choice OV in 6 weeks

## 2019-05-31 NOTE — Telephone Encounter (Signed)
Advised pt of results. Pt understood and nothing further is needed.   CPAP ordered.  

## 2019-06-18 ENCOUNTER — Other Ambulatory Visit: Payer: Self-pay | Admitting: Cardiology

## 2019-06-18 DIAGNOSIS — R0609 Other forms of dyspnea: Secondary | ICD-10-CM

## 2019-06-18 DIAGNOSIS — I10 Essential (primary) hypertension: Secondary | ICD-10-CM

## 2019-06-25 ENCOUNTER — Ambulatory Visit: Payer: No Typology Code available for payment source | Admitting: Pulmonary Disease

## 2019-07-17 ENCOUNTER — Ambulatory Visit: Payer: No Typology Code available for payment source | Admitting: Pulmonary Disease

## 2019-07-19 ENCOUNTER — Ambulatory Visit: Payer: No Typology Code available for payment source | Admitting: Cardiology

## 2019-07-30 ENCOUNTER — Ambulatory Visit: Payer: No Typology Code available for payment source | Admitting: Pulmonary Disease

## 2019-08-15 ENCOUNTER — Encounter: Payer: Self-pay | Admitting: Pulmonary Disease

## 2019-08-15 ENCOUNTER — Ambulatory Visit: Payer: No Typology Code available for payment source | Admitting: Pulmonary Disease

## 2019-08-15 ENCOUNTER — Ambulatory Visit (INDEPENDENT_AMBULATORY_CARE_PROVIDER_SITE_OTHER): Payer: No Typology Code available for payment source | Admitting: Pulmonary Disease

## 2019-08-15 DIAGNOSIS — G4733 Obstructive sleep apnea (adult) (pediatric): Secondary | ICD-10-CM

## 2019-08-15 DIAGNOSIS — K5289 Other specified noninfective gastroenteritis and colitis: Secondary | ICD-10-CM

## 2019-08-15 DIAGNOSIS — R0609 Other forms of dyspnea: Secondary | ICD-10-CM

## 2019-08-15 DIAGNOSIS — R0600 Dyspnea, unspecified: Secondary | ICD-10-CM | POA: Diagnosis not present

## 2019-08-15 NOTE — Assessment & Plan Note (Addendum)
Primary care is working with the patient regarding this diagnosis as well as potentially mastocytosis.  Multiple new medications were recently prescribed.  Patient reports this is improved significantly with prednisone.  Plan:  Continue to follow up with PCP  Have PCP fax notes to our office: [phone]

## 2019-08-15 NOTE — Assessment & Plan Note (Addendum)
Patient reporting that he is using his CPAP every night.  He reports that he is a refurbish CPAP from his DME company [employer].  CPAP compliance report from September/17/2020 through December/15/2020 shows only 8 days of use over 90 days.  [employer] reporting that the patient is not using his CPAP.  We have placed a ticket for [employer] to follow-up with the patient.  Plan:  Continue CPAP  Ticket placed for [employer] to service patient CPAP

## 2019-08-15 NOTE — Progress Notes (Signed)
Virtual Visit via Telephone Note  I connected with Henry Thomas on 08/15/19 at  2:00 PM EST by telephone and verified that I am speaking with the correct person using two identifiers.  Location: Patient: Home Provider: Office [facility] - [address] [place], [address], [place], [place] [address]   I discussed the limitations, risks, security and privacy concerns of performing an evaluation and management service by telephone and the availability of in person appointments. I also discussed with the patient that there may be a patient responsible charge related to this service. The patient expressed understanding and agreed to proceed.  Patient consented to consult via telephone: Yes People present and their role in pt care: Pt   History of Present Illness:  34 year old male never smoker initially consulted with our practice on 05/14/2019 for suspected obstructive sleep apnea and dyspnea on exertion  Past medical history: Hypertension, morbid obesity Smoking history: Never smoker Maintenance: Symbicort 160 Patient of Dr. Vassie Loll  Chief complaint: 3 month    34 year old male never smoker last seen in our office on 05/14/2019.  At that office visit Dr. Vassie Loll ordered a CT of his chest to rule out any blood clots.  He also ordered a home sleep study.  Based off the home sleep study we could consider starting CPAP therapy.  His Symbicort inhaler was continued.  May need to consider pulmonary function test based off of CT chest.  CT completed in October/2020 did not show any pulmonary embolisms.  Home sleep study completed in October/2020 showed moderate obstructive sleep apnea.  Patient was recommended to be started on auto CPAP 5-20.  Recommended to have follow-up in 6 weeks.  Patient reports that he started on CPAP therapy.  He struggled with the initial mask fit.  He is doing better on a full facemask.  Unfortunately CPAP compliance report does not reflect this.  CPAP compliance report listed  below:  04/25/2019-07/23/2019-CPAP compliance-8 out of 90 days use, 2 of those days greater than 4 hours, average usage 2 hours and 10 minutes, APAP setting 5-20, AHI 2.6  Patient does feel that his breathing has improved he attributes this largely to primary care started him on steroids occasionally due to his mast ascitic enterocolitis.  This was originally diagnosed in 2010 in [place].  Patient also reports that primary care may be considering a referral to gastroenterology or an additional specialist.  New medications were recently prescribed.  Patient is unsure what the names of these are.  We do not have access to these records.  Patient ran out of his Symbicort 160 may be a month ago he reports he does not feel that he needs this.  Patient is emphatic that he is even using his CPAP.  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  He reports he has been paying them out of pocket.  He does not believe that he has an SD card.  He is unsure whether compliance report does not match.  We will need to work with [employer] to have his machine serviced.  Observations/Objective:  05/23/2019-CTA chest-no CT evidence of large central pulmonary embolus, segmental and subsegmental pulmonary arteries are not reliably evaluated due to substantial image noise related to body habitus and technique, no pulmonary arterial enlargement  05/28/2019-home sleep study-AHI 19.4, SaO2 low 84%  Social History   Tobacco Use  Smoking Status Never Smoker  Smokeless Tobacco Never Used    There is no immunization history on file for  this patient.  Assessment and Plan:  Mastocytic enterocolitis Primary care is working with the patient regarding this diagnosis as well as potentially mastocytosis.  Multiple new medications were recently prescribed.  Patient reports this is improved significantly with prednisone.  Plan:  Continue to follow up with PCP  Have PCP fax notes to our office: [phone]  OSA  (obstructive sleep apnea) Patient reporting that he is using his CPAP every night.  He reports that he is a refurbish CPAP from his DME company [employer].  CPAP compliance report from September/17/2020 through December/15/2020 shows only 8 days of use over 90 days.  [employer] reporting that the patient is not using his CPAP.  We have placed a ticket for [employer] to follow-up with the patient.  Plan:  Continue CPAP  Ticket placed for [employer] to service patient CPAP  Exertional dyspnea Feels this has improved with prednisone  Patient reports he ran out of Symbicort a while ago has not needed it  Plan: We will continue to monitor this clinically Requested patient to have primary care fax [place] records May need to consider pulmonary function testing in the future  Morbid obesity ([place]) Plan: Continue to work on improving overall physical activity Work on reducing BMI   Follow Up Instructions:  Return in about 4 weeks (around 09/12/2019), or if symptoms worsen or fail to improve, for Follow up with Dr. Elsworth Soho.   I discussed the assessment and treatment plan with the patient. The patient was provided an opportunity to ask questions and all were answered. The patient agreed with the plan and demonstrated an understanding of the instructions.   The patient was advised to call back or seek an in-person evaluation if the symptoms worsen or if the condition fails to improve as anticipated.  I provided 32 minutes of non-face-to-face time during this encounter.   Lauraine Rinne, NP

## 2019-08-15 NOTE — Addendum Note (Signed)
Addended by: Maxwell Marion A on: 08/15/2019 02:48 PM   Modules accepted: Orders

## 2019-08-15 NOTE — Patient Instructions (Addendum)
You were seen today by Coral Ceo, NP  for:   1. OSA (obstructive sleep apnea)  - Ambulatory Referral for DME  We have placed an order for [facility] to contact you regarding your CPAP use.  We recommend that you continue using your CPAP daily >>>Keep up the hard work using your device >>> Goal should be wearing this for the entire night that you are sleeping, at least 4 to 6 hours  Remember:  . Do not drive or operate heavy machinery if tired or drowsy.  . Please notify the supply company and office if you are unable to use your device regularly due to missing supplies or machine being broken.  . Work on maintaining a healthy weight and following your recommended nutrition plan  . Maintain proper daily exercise and movement  . Maintaining proper use of your device can also help improve management of other chronic illnesses such as: Blood pressure, blood sugars, and weight management.   BiPAP/ CPAP Cleaning:  >>>Clean weekly, with Dawn soap, and bottle brush.  Set up to air dry. >>> Wipe mask out daily with wet wipe or towelette    2. Exertional dyspnea  We will continue to monitor this clinically I am glad that this is improved with your recent prednisone use from primary care  3. Mastocytic enterocolitis  Continue to follow-up with primary care Please have records from primary care's office visit faxed to our office at [phone] -attention Dr. Vassie Loll  4. Morbid obesity (HCC)  Continue to work on overall physical activity and work to reduce BMI   We recommend today:  Orders Placed This Encounter  Procedures  . Ambulatory Referral for DME    Referral Priority:   Routine    Referral Type:   Durable Medical Equipment Purchase    Number of Visits Requested:   1   Orders Placed This Encounter  Procedures  . Ambulatory Referral for DME   No orders of the defined types were placed in this encounter.   Follow Up:    Return in about 4 weeks (around 09/12/2019), or  if symptoms worsen or fail to improve, for Follow up with Dr. Vassie Loll.   Please do your part to reduce the spread of COVID-19:      Reduce your risk of any infection  and COVID19 by using the similar precautions used for avoiding the common cold or flu:  Marland Kitchen Wash your hands often with soap and warm water for at least 20 seconds.  If soap and water are not readily available, use an alcohol-based hand sanitizer with at least 60% alcohol.  . If coughing or sneezing, cover your mouth and nose by coughing or sneezing into the elbow areas of your shirt or coat, into a tissue or into your sleeve (not your hands). Drinda Butts A MASK when in public  . Avoid shaking hands with others and consider head nods or verbal greetings only. . Avoid touching your eyes, nose, or mouth with unwashed hands.  . Avoid close contact with people who are sick. . Avoid places or events with large numbers of people in one location, like concerts or sporting events. . If you have some symptoms but not all symptoms, continue to monitor at home and seek medical attention if your symptoms worsen. . If you are having a medical emergency, call 911.   ADDITIONAL HEALTHCARE OPTIONS FOR PATIENTS  [place] [facility] / e-Visit: [email or web address]  [facility]: [facility]: [id number]                   [facility]: [id number]     It is flu season:   >>> Best ways to protect herself from the flu: Receive the yearly flu vaccine, practice good hand hygiene washing with soap and also using hand sanitizer when available, eat a nutritious meals, get adequate rest, hydrate appropriately   Please contact the office if your symptoms worsen or you have concerns that you are not improving.   Thank you for choosing  [facility] for your healthcare, and for allowing [place] to partner with you on your healthcare journey. I am  thankful to be able to provide care to you today.   Wyn Quaker FNP-C

## 2019-08-15 NOTE — Assessment & Plan Note (Signed)
Feels this has improved with prednisone  Patient reports he ran out of Symbicort a while ago has not needed it  Plan: We will continue to monitor this clinically Requested patient to have primary care fax [place] records May need to consider pulmonary function testing in the future

## 2019-08-15 NOTE — Assessment & Plan Note (Signed)
Plan: Continue to work on improving overall physical activity Work on reducing BMI

## 2019-08-21 NOTE — Progress Notes (Signed)
    Patient referred by [name], MND for hypertension, shortness of breath  Subjective:   Henry Thomas, male    DOB: 1985-10-05, 34 y.o.   MRN: [id number]   Chief Complaint  Patient presents with  . Hypertension    HPI  34 y.o. African American male with hypertension, morbid obesity, h/o mastocytic enterocolitis, strong family history of CAD and hypertension, with exertional dyspnea  Echocardiogram was unremarkable, other than mo LVH and grade 1 DD. Blood pressure elevated today, but usually lower. He is not taking any antihypertensive medications at this time. He is seeing Dr. Karlton Lemon and GI for mastocytic enterocolitis and is currently on budesonide, with improvement in his shortness of breath, generalized fatigue symptoms. He has OSA and is now on CPAP.  Current Outpatient Medications on File Prior to Visit  Medication Sig Dispense Refill  . budesonide (ENTOCORT EC) 3 MG 24 hr capsule Take 3 mg by mouth 3 (three) times daily.    . diphenhydrAMINE (BENADRYL) 25 MG tablet Take 1 tablet (25 mg total) by mouth every 6 (six) hours as needed. 30 tablet 0  . Multiple Vitamin (MULTIVITAMIN) capsule Take 1 capsule by mouth daily.    . Probiotic Product (PROBIOTIC DAILY PO) Take 1 tablet by mouth daily.     . TURMERIC PO Take 1 tablet by mouth daily.      No current facility-administered medications on file prior to visit.    Cardiovascular studies:  Echocardiogram 04/19/2019: Normal LV systolic function with EF 55%. Left ventricle cavity is normal in size. Moderate concentric hypertrophy of the left ventricle. Normal global wall motion. Doppler evidence of grade I (impaired) diastolic dysfunction, normal LAP. Calculated EF 55%. Left atrial cavity is mildly dilated. Inadequate TR jet to estimate pulmonary artery systolic pressure. Estimated RA pressure 8 mmHg.  EKG 03/28/2019: Sinus rhythm 94 bpm. 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  Poor R-wave progression  -nondiagnostic for this age.    Recent labs: 02/06/2019: Glucose 100. BUN/Cr 9/1.21. eGFR 78/90. Na/K 142/4.7. HbA1C 5.8%.  H/H 17/52. MCV 81. Platelets 278.  Review of Systems  Cardiovascular: Negative for chest pain, dyspnea on exertion, leg swelling, palpitations and syncope.         Vitals:   08/22/19 1049  BP: (!) 151/108  Pulse: 80  SpO2: 93%     Body mass index is 53 kg/m. Filed Weights   08/22/19 1049  Weight: (!) 424 lb (192.3 kg)     Objective:   Physical Exam  Constitutional: He appears well-developed and well-nourished.  Obese  Neck: No JVD present.  Cardiovascular: Normal rate, regular rhythm, normal heart sounds and intact distal pulses.  No murmur heard. Pulmonary/Chest: Effort normal and breath sounds normal. He has no wheezes. He has no rales.  Musculoskeletal:        General: No edema.  Nursing note and vitals reviewed.         Assessment & Recommendations:   34 y.o. African American male with hypertension, morbid obesity, h/o mastocytic enterocolitis, strong family history of CAD and hypertension, with exertional dyspnea  Exertional dyspnea: Likely multifactorial, including hypertension, morbid obesity. Echocardiogram shows normal systolic function, grade 1 DD. Recommend f/u w/POCP and re-introduce HCTZ if BP remains elevated.  No active cardiac illness requiring management. I will see him on as needed basis.  Nigel Mormon, MD [facility] Pager: [phone] Office: [phone] If no answer Cell [phone]

## 2019-08-22 ENCOUNTER — Ambulatory Visit (INDEPENDENT_AMBULATORY_CARE_PROVIDER_SITE_OTHER): Payer: No Typology Code available for payment source | Admitting: Cardiology

## 2019-08-22 ENCOUNTER — Other Ambulatory Visit: Payer: Self-pay

## 2019-08-22 ENCOUNTER — Encounter: Payer: Self-pay | Admitting: Cardiology

## 2019-08-22 VITALS — BP 151/108 | HR 80 | Ht 75.0 in | Wt >= 6400 oz

## 2019-08-22 DIAGNOSIS — K5289 Other specified noninfective gastroenteritis and colitis: Secondary | ICD-10-CM

## 2019-08-22 DIAGNOSIS — I10 Essential (primary) hypertension: Secondary | ICD-10-CM | POA: Diagnosis not present

## 2019-08-22 DIAGNOSIS — R0609 Other forms of dyspnea: Secondary | ICD-10-CM

## 2019-09-12 ENCOUNTER — Ambulatory Visit: Payer: No Typology Code available for payment source | Admitting: Adult Health

## 2019-09-12 ENCOUNTER — Ambulatory Visit: Payer: No Typology Code available for payment source | Admitting: Pulmonary Disease

## 2020-09-04 IMAGING — CT CT ANGIO CHEST
2 of 16 series · 16 of 38 positions shown · IV contrast (omnipaque)
Comparison: None.

CLINICAL DATA: Shortness of breath. PE suspected.

EXAM:
CT ANGIOGRAPHY CHEST WITH CONTRAST
TECHNIQUE: Multidetector CT imaging of the chest was performed using the
standard protocol during bolus administration of intravenous
contrast. Multiplanar CT image reconstructions and MIPs were
obtained to evaluate the vascular anatomy.
CONTRAST:  185mL OMNIPAQUE IOHEXOL 350 MG/ML SOLN

[Series 12: pe thins · axial · 0.89mm/px · z∈[-286,-60]mm · 9 of 377 slices shown (1 of 2)]
[im 38/377  lung]
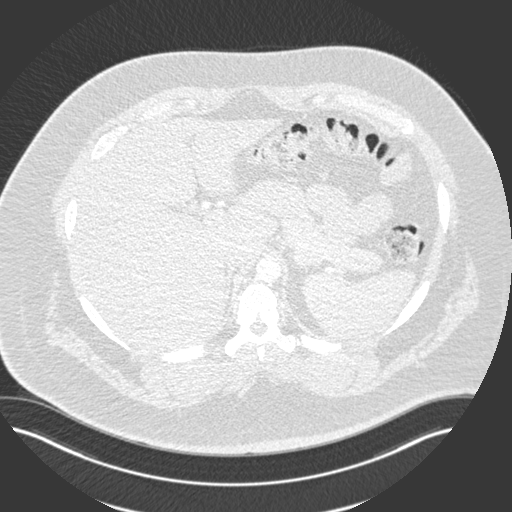
[im 76/377  mediastinal]
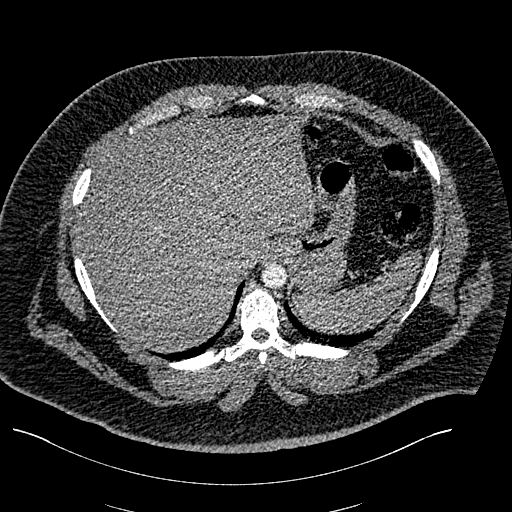
[im 113/377  lung]
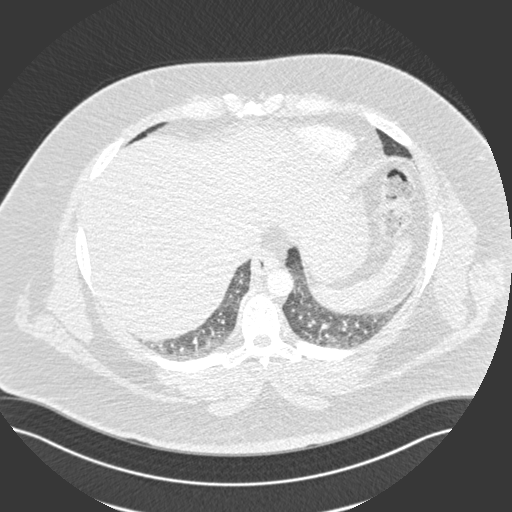
[im 151/377  mediastinal]
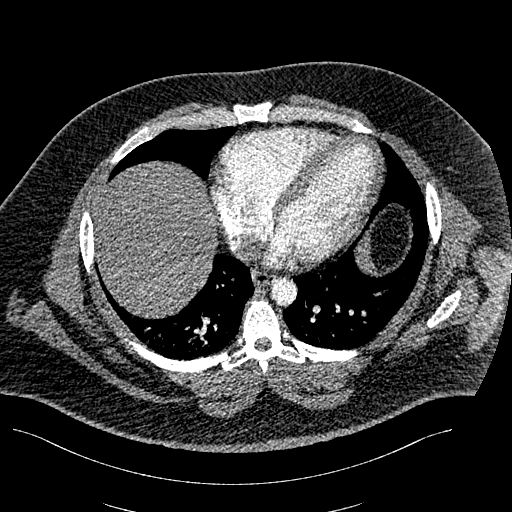
[im 189/377  lung]
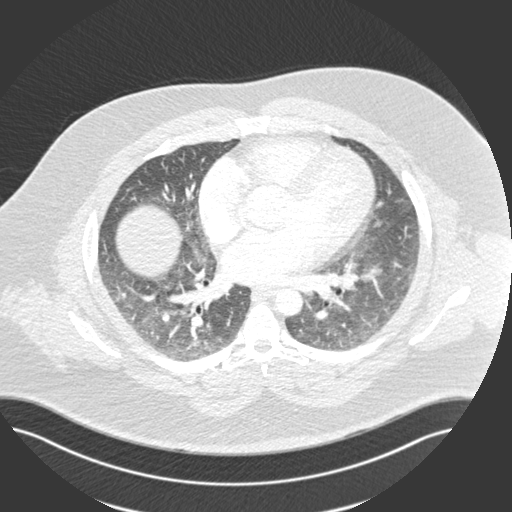
[im 226/377  mediastinal]
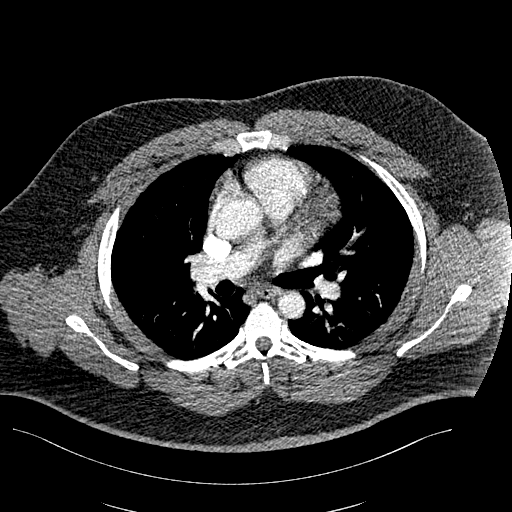
[im 264/377  lung]
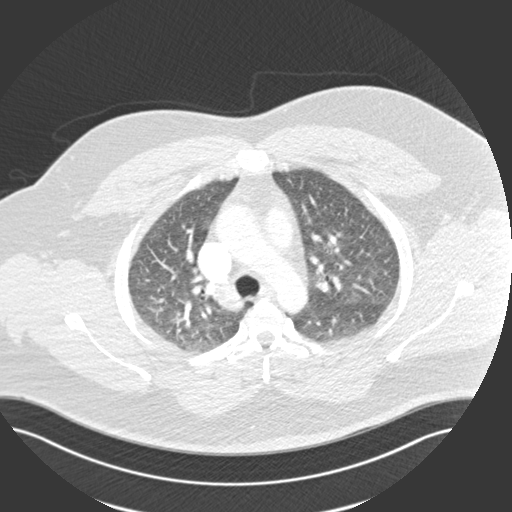
[im 301/377  mediastinal]
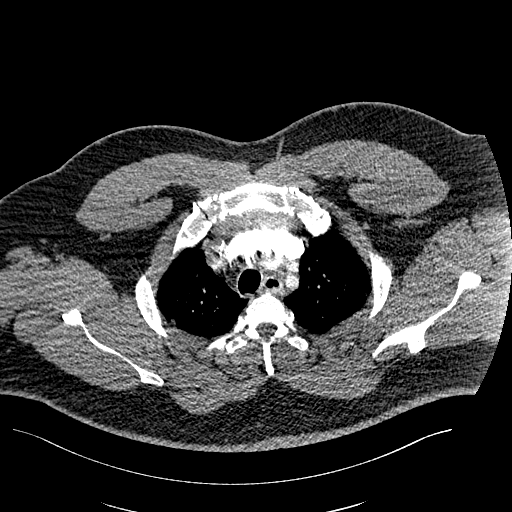
[im 339/377  lung]
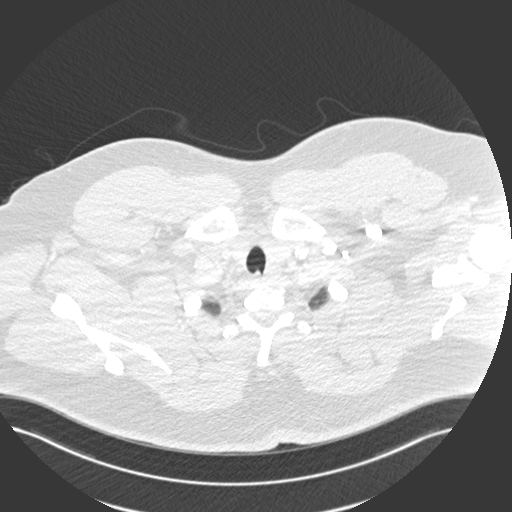

[Series 19: pe thins · axial · 0.98mm/px · z∈[-283,-67]mm · 7 of 289 slices shown (2 of 2)]
[im 37/289  lung]
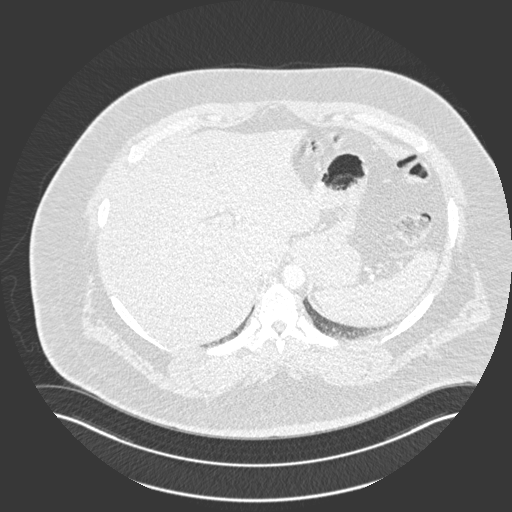
[im 73/289  lung]
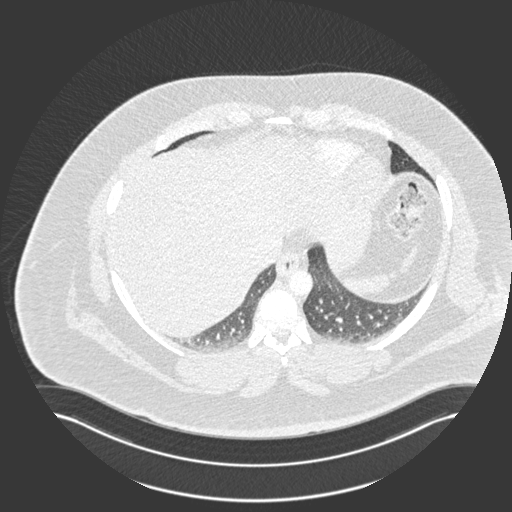
[im 109/289  lung]
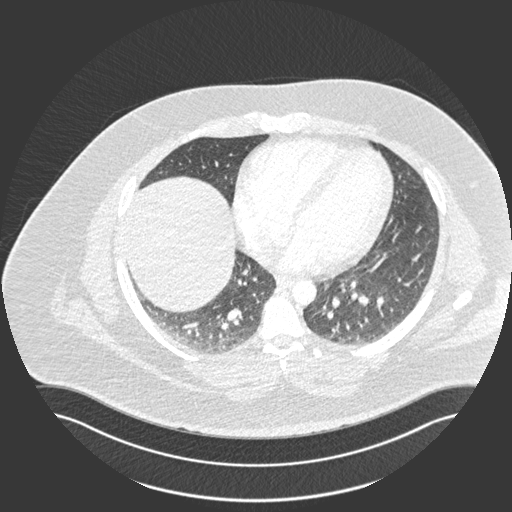
[im 145/289  lung]
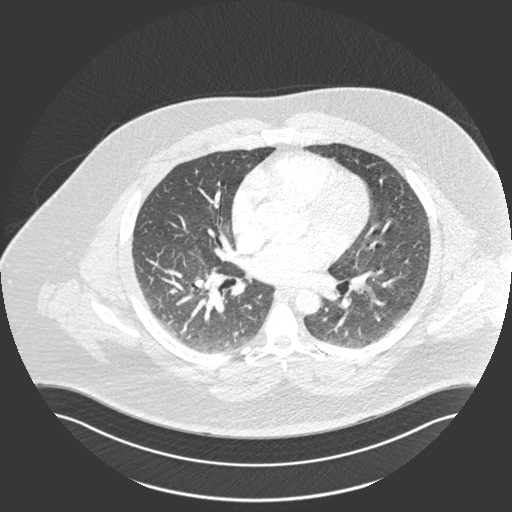
[im 181/289  lung]
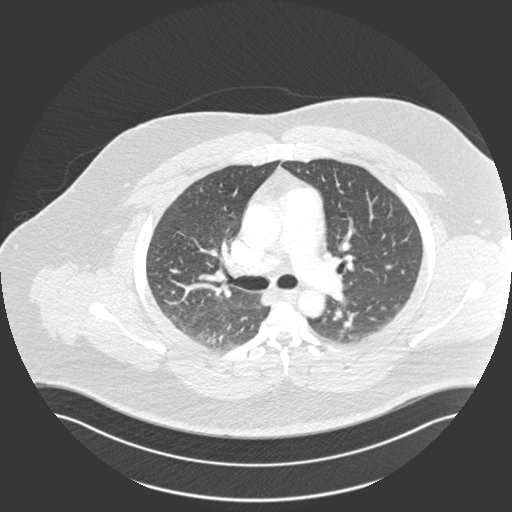
[im 217/289  lung]
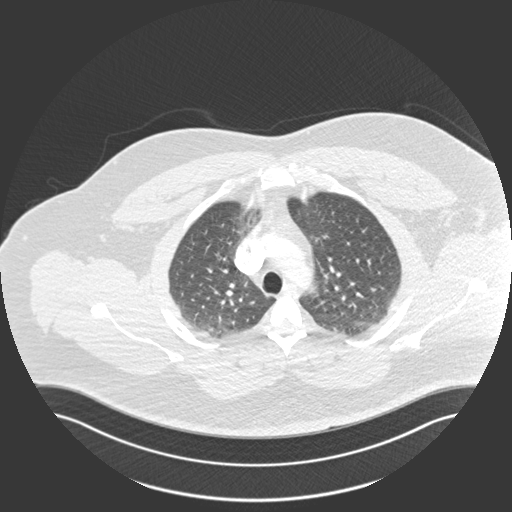
[im 253/289  lung]
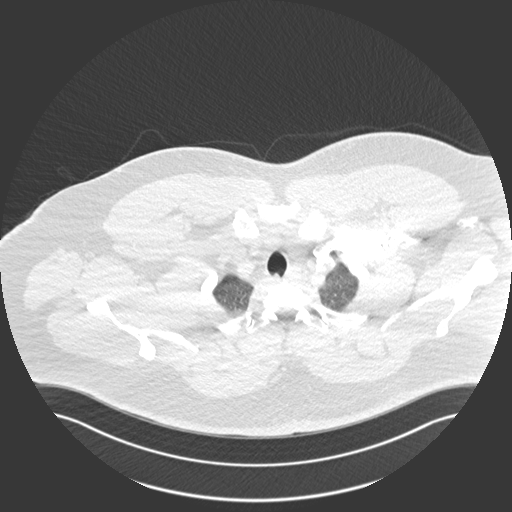

[16 of 38 positions shown; findings below may reference images not displayed]

FINDINGS: Initial imaging resulted in poor pulmonary arterial opacification
likely is a combination of technique and body habitus. Given normal
renal function, repeat imaging was performed.

Cardiovascular: Heart size upper normal. No pericardial effusion. No
thoracic aortic aneurysm. No pulmonary arterial enlargement. No
large central pulmonary embolus noted. No filling defect in the
lobar pulmonary arteries. Although no filling defect is evident
within segmental and subsegmental pulmonary arteries, assessment is
considered unreliable due to substantial image noise related to body
habitus and technique.

Mediastinum/Nodes: No mediastinal lymphadenopathy. There is no hilar
lymphadenopathy. The esophagus has normal imaging features. There is
no axillary lymphadenopathy.

Lungs/Pleura: Minimal compressive atelectasis noted in the dependent
lungs bilaterally. No suspicious pulmonary nodule or mass. No focal
airspace consolidation. No pleural effusion.

Upper Abdomen: The liver shows diffusely decreased attenuation
suggesting fat deposition.

Musculoskeletal: No worrisome lytic or sclerotic osseous
abnormality.

Review of the MIP images confirms the above findings.
IMPRESSION: 1. No CT evidence for large central pulmonary embolus. Segmental and
subsegmental pulmonary arteries are not reliably evaluated due to
substantial image noise related to body habitus and technique.
2. No pulmonary arterial enlargement.
